# Patient Record
Sex: Male | Born: 1995 | Race: Black or African American | Hispanic: No | Marital: Single | Smoking: Never smoker
Health system: Southern US, Community
[De-identification: ages and names within clinical notes are randomized; demographics above are authoritative.]

## PROBLEM LIST (undated history)

## (undated) DIAGNOSIS — S93402A Sprain of unspecified ligament of left ankle, initial encounter: Secondary | ICD-10-CM

## (undated) HISTORY — PX: CIRCUMCISION: SUR203

---
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
## 2016-03-16 IMAGING — CR DG HAND COMPLETE 3+V*L*
3 series · 3 of 3 positions shown · non-contrast
Comparison: None.

CLINICAL DATA: Fall on hand. Pain all over metacarpal region.
Abrasions on top of hand.

EXAM:
LEFT HAND - COMPLETE 3+ VIEW

[hand ap]
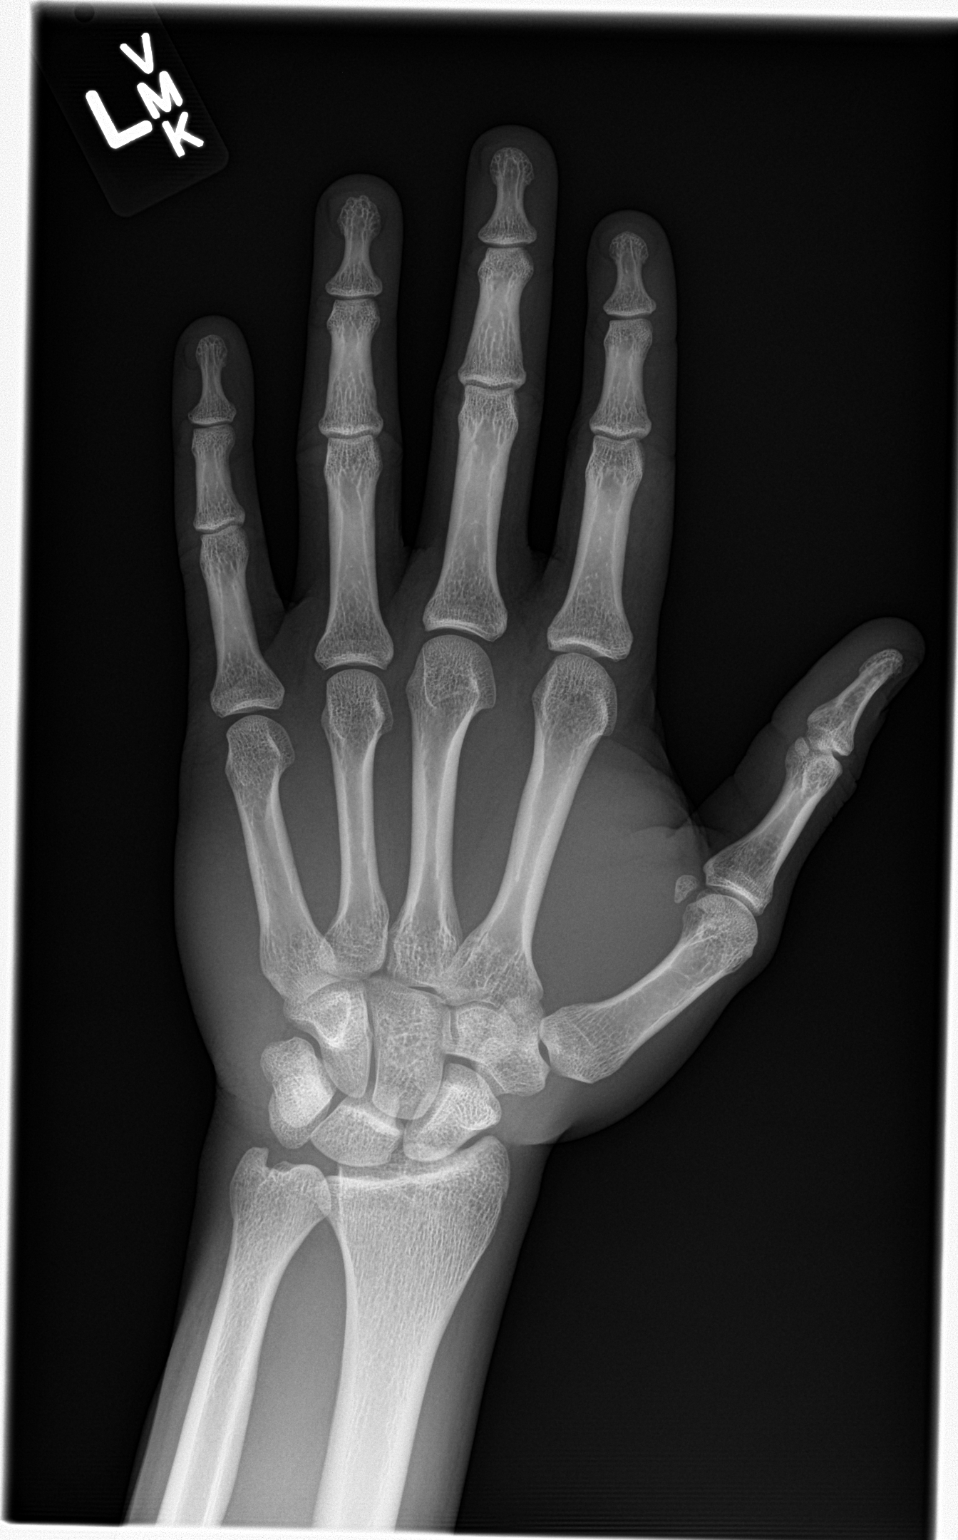

[hand lat]
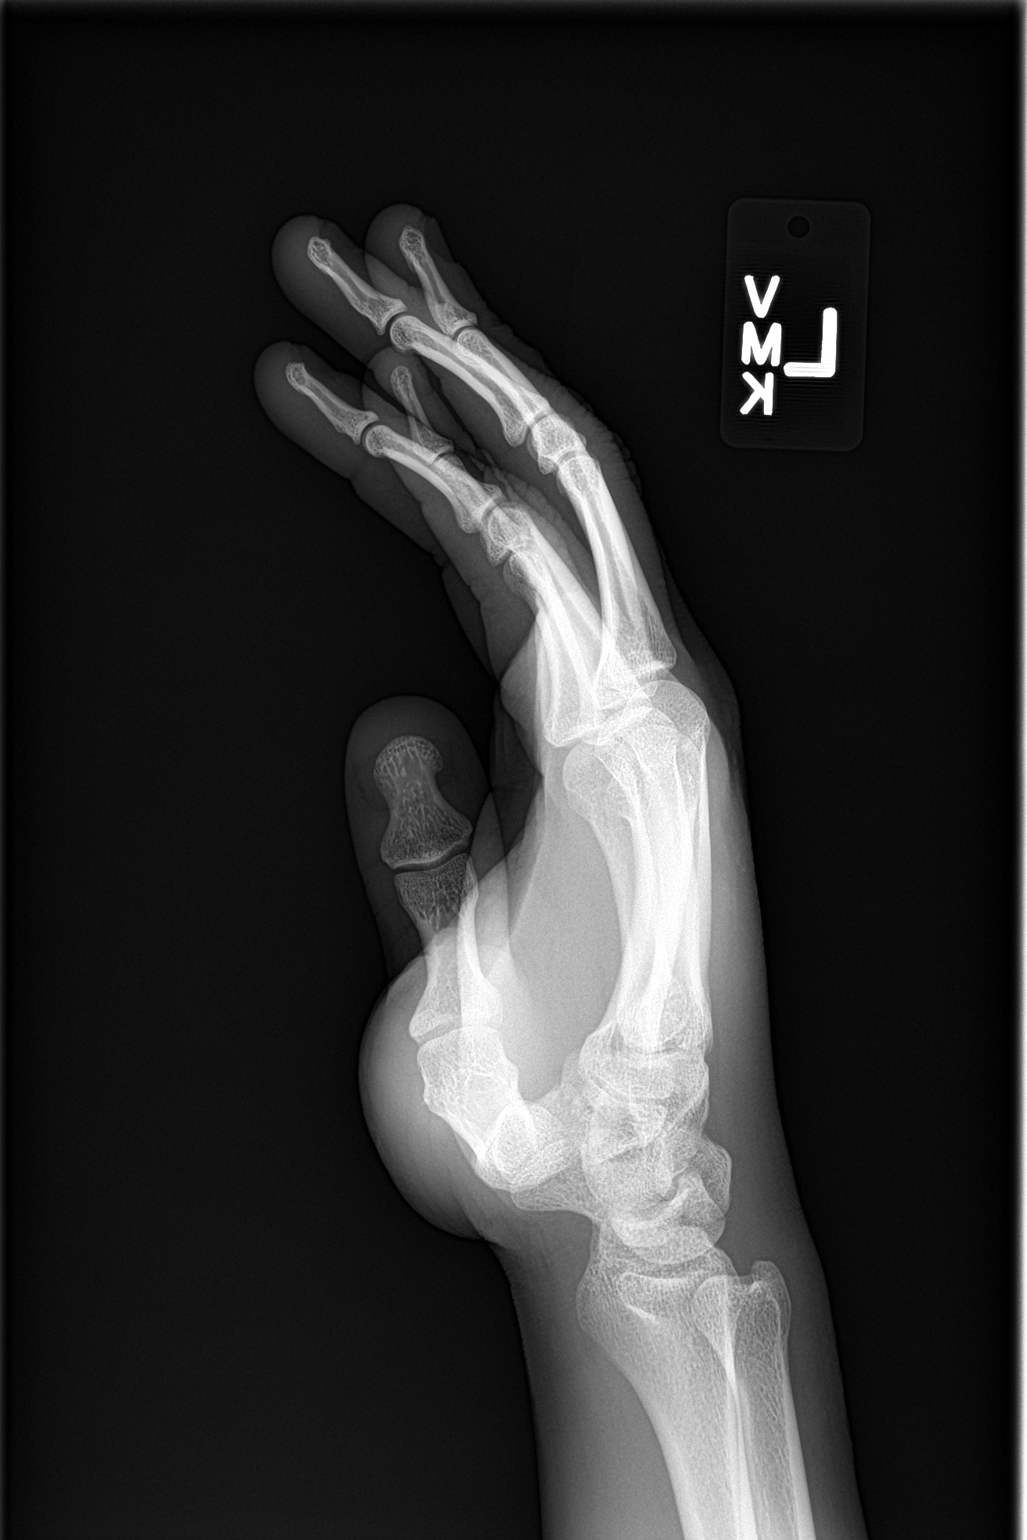

[hand obl]
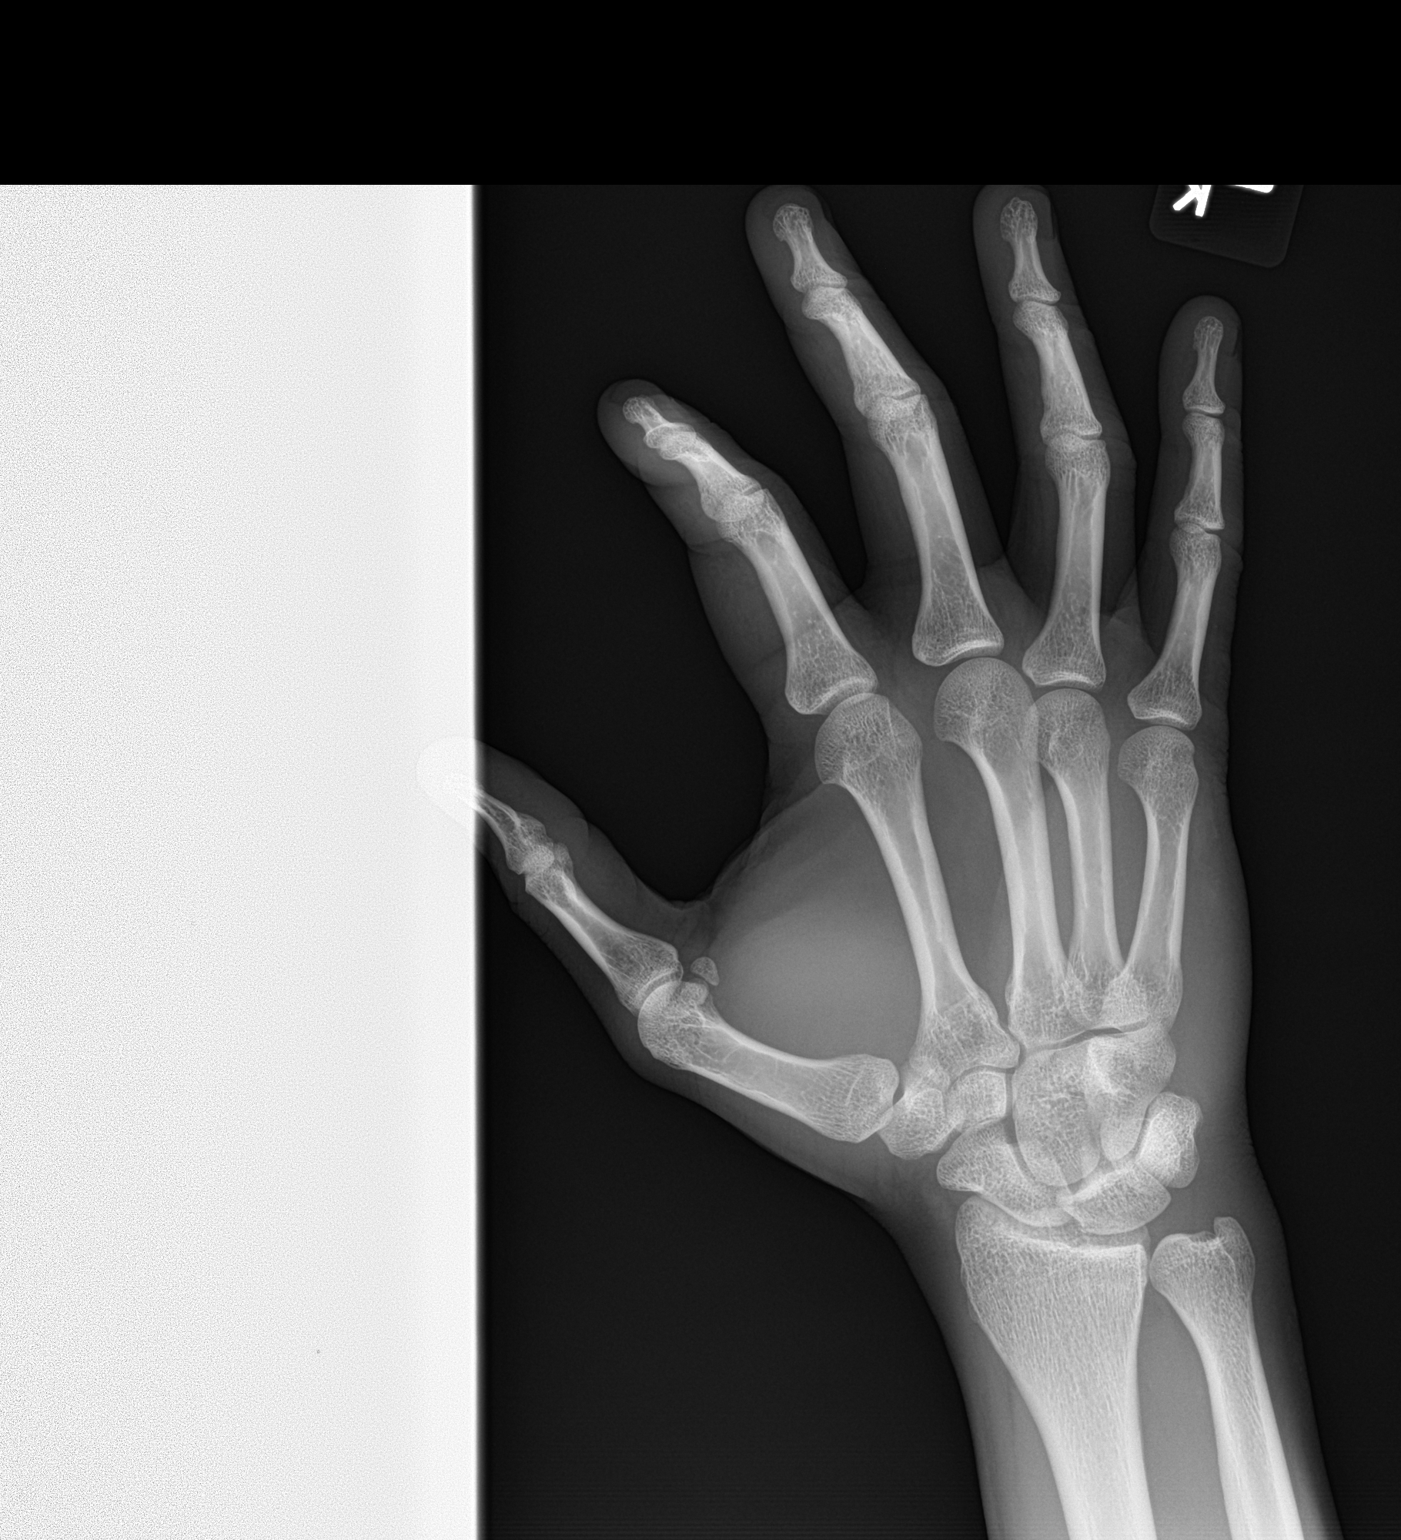

[3 of 3 positions shown; findings below may reference images not displayed]

FINDINGS: There is no evidence of fracture or dislocation. There is no
evidence of arthropathy or other focal bone abnormality. Soft
tissues are unremarkable.
IMPRESSION: Negative.
